# Patient Record
Sex: Female | Born: 1975 | Race: White | Hispanic: No | Marital: Married | Smoking: Current every day smoker
Health system: Southern US, Community
[De-identification: ages and names within clinical notes are randomized; demographics above are authoritative.]

## PROBLEM LIST (undated history)

## (undated) HISTORY — PX: ABDOMINAL SURGERY: SHX537

## (undated) HISTORY — PX: ABLATION: SHX5711

---

## 2012-10-01 ENCOUNTER — Emergency Department: Payer: Self-pay | Admitting: Emergency Medicine

## 2012-10-04 ENCOUNTER — Emergency Department: Payer: Self-pay | Admitting: Emergency Medicine

## 2014-12-24 ENCOUNTER — Emergency Department: Payer: Self-pay

## 2014-12-24 ENCOUNTER — Encounter: Payer: Self-pay | Admitting: Emergency Medicine

## 2014-12-24 ENCOUNTER — Emergency Department
Admission: EM | Admit: 2014-12-24 | Discharge: 2014-12-24 | Disposition: A | Discharge status: Home / Self Care | Payer: Self-pay | Attending: Emergency Medicine | Admitting: Emergency Medicine

## 2014-12-24 DIAGNOSIS — R1031 Right lower quadrant pain: Secondary | ICD-10-CM

## 2014-12-24 DIAGNOSIS — N8320 Unspecified ovarian cysts: Secondary | ICD-10-CM | POA: Insufficient documentation

## 2014-12-24 DIAGNOSIS — Z3202 Encounter for pregnancy test, result negative: Secondary | ICD-10-CM | POA: Insufficient documentation

## 2014-12-24 DIAGNOSIS — R911 Solitary pulmonary nodule: Secondary | ICD-10-CM | POA: Insufficient documentation

## 2014-12-24 DIAGNOSIS — Z720 Tobacco use: Secondary | ICD-10-CM | POA: Insufficient documentation

## 2014-12-24 LAB — CBC
HCT: 38.6 % (ref 35.0–47.0)
HEMOGLOBIN: 13 g/dL (ref 12.0–16.0)
MCH: 31.9 pg (ref 26.0–34.0)
MCHC: 33.7 g/dL (ref 32.0–36.0)
MCV: 94.5 fL (ref 80.0–100.0)
PLATELETS: 267 10*3/uL (ref 150–440)
RBC: 4.09 MIL/uL (ref 3.80–5.20)
RDW: 14 % (ref 11.5–14.5)
WBC: 9.5 10*3/uL (ref 3.6–11.0)

## 2014-12-24 LAB — URINALYSIS COMPLETE WITH MICROSCOPIC (ARMC ONLY)
BILIRUBIN URINE: NEGATIVE
Glucose, UA: NEGATIVE mg/dL
Hgb urine dipstick: NEGATIVE
Ketones, ur: NEGATIVE mg/dL
Leukocytes, UA: NEGATIVE
Nitrite: NEGATIVE
PH: 8 (ref 5.0–8.0)
Protein, ur: NEGATIVE mg/dL
Specific Gravity, Urine: 1.01 (ref 1.005–1.030)

## 2014-12-24 LAB — POCT PREGNANCY, URINE: Preg Test, Ur: NEGATIVE

## 2014-12-24 LAB — BASIC METABOLIC PANEL
Anion gap: 7 (ref 5–15)
BUN: 18 mg/dL (ref 6–20)
CALCIUM: 8.9 mg/dL (ref 8.9–10.3)
CO2: 25 mmol/L (ref 22–32)
CREATININE: 0.91 mg/dL (ref 0.44–1.00)
Chloride: 106 mmol/L (ref 101–111)
GFR calc Af Amer: >60 mL/min (ref >60)
GFR calc non Af Amer: >60 mL/min (ref >60)
GLUCOSE: 119 mg/dL — AB (ref 65–99)
Potassium: 4 mmol/L (ref 3.5–5.1)
Sodium: 138 mmol/L (ref 135–145)

## 2014-12-24 LAB — CHLAMYDIA/NGC RT PCR (ARMC ONLY)
CHLAMYDIA TR: NOT DETECTED
N gonorrhoeae: NOT DETECTED

## 2014-12-24 LAB — WET PREP, GENITAL
CLUE CELLS WET PREP: NONE SEEN
TRICH WET PREP: NONE SEEN
WBC, Wet Prep HPF POC: NONE SEEN
YEAST WET PREP: NONE SEEN

## 2014-12-24 LAB — TROPONIN I

## 2014-12-24 MED ORDER — MORPHINE SULFATE (PF) 4 MG/ML IV SOLN
4.0000 mg | Freq: Once | INTRAVENOUS | Status: AC
  Administered 2014-12-24: 4 mg via INTRAVENOUS
  Filled 2014-12-24: qty 1

## 2014-12-24 MED ORDER — OXYCODONE-ACETAMINOPHEN 5-325 MG PO TABS: 1.0000 | ORAL_TABLET | Freq: Four times a day (QID) | ORAL | Status: DC | PRN

## 2014-12-24 MED ORDER — ONDANSETRON HCL 4 MG/2ML IJ SOLN
4.0000 mg | Freq: Once | INTRAMUSCULAR | Status: AC
  Administered 2014-12-24: 4 mg via INTRAVENOUS
  Filled 2014-12-24: qty 2

## 2014-12-24 MED ORDER — IOHEXOL 350 MG/ML SOLN
125.0000 mL | Freq: Once | INTRAVENOUS | Status: AC | PRN
  Administered 2014-12-24: 150 mL via INTRAVENOUS
  Filled 2014-12-24: qty 125

## 2014-12-24 MED ORDER — SODIUM CHLORIDE 0.9 % IV BOLUS (SEPSIS)
1000.0000 mL | Freq: Once | INTRAVENOUS | Status: AC
  Administered 2014-12-24: 1000 mL via INTRAVENOUS

## 2014-12-24 NOTE — ED Notes (Signed)
Urine preg NEGATIVE. 

## 2014-12-24 NOTE — ED Notes (Signed)
Pt states right sided lower abd pain and left sided tenderness, states increasing pain this AM with chest pain now, pressure on the left side, right sided pain is constant, states pressure when  She urinates, states chest pains are barely there now, denies any vaginal bleeding or discharge

## 2014-12-24 NOTE — ED Notes (Signed)
Pt presents with chest pain and abd pain started this am. Denies any n/v/d.

## 2014-12-24 NOTE — Discharge Instructions (Signed)
Abdominal Pain, Women °Abdominal (stomach, pelvic, or belly) pain can be caused by many things. It is important to tell your doctor: °· The location of the pain. °· Does it come and go or is it present all the time? °· Are there things that start the pain (eating certain foods, exercise)? °· Are there other symptoms associated with the pain (fever, nausea, vomiting, diarrhea)? °All of this is helpful to know when trying to find the cause of the pain. °CAUSES  °· Stomach: virus or bacteria infection, or ulcer. °· Intestine: appendicitis (inflamed appendix), regional ileitis (Crohn's disease), ulcerative colitis (inflamed colon), irritable bowel syndrome, diverticulitis (inflamed diverticulum of the colon), or cancer of the stomach or intestine. °· Gallbladder disease or stones in the gallbladder. °· Kidney disease, kidney stones, or infection. °· Pancreas infection or cancer. °· Fibromyalgia (pain disorder). °· Diseases of the female organs: °· Uterus: fibroid (non-cancerous) tumors or infection. °· Fallopian tubes: infection or tubal pregnancy. °· Ovary: cysts or tumors. °· Pelvic adhesions (scar tissue). °· Endometriosis (uterus lining tissue growing in the pelvis and on the pelvic organs). °· Pelvic congestion syndrome (female organs filling up with blood just before the menstrual period). °· Pain with the menstrual period. °· Pain with ovulation (producing an egg). °· Pain with an IUD (intrauterine device, birth control) in the uterus. °· Cancer of the female organs. °· Functional pain (pain not caused by a disease, may improve without treatment). °· Psychological pain. °· Depression. °DIAGNOSIS  °Your doctor will decide the seriousness of your pain by doing an examination. °· Blood tests. °· X-rays. °· Ultrasound. °· CT scan (computed tomography, special type of X-ray). °· MRI (magnetic resonance imaging). °· Cultures, for infection. °· Barium enema (dye inserted in the large intestine, to better view it with  X-rays). °· Colonoscopy (looking in intestine with a lighted tube). °· Laparoscopy (minor surgery, looking in abdomen with a lighted tube). °· Major abdominal exploratory surgery (looking in abdomen with a large incision). °TREATMENT  °The treatment will depend on the cause of the pain.  °· Many cases can be observed and treated at home. °· Over-the-counter medicines recommended by your caregiver. °· Prescription medicine. °· Antibiotics, for infection. °· Birth control pills, for painful periods or for ovulation pain. °· Hormone treatment, for endometriosis. °· Nerve blocking injections. °· Physical therapy. °· Antidepressants. °· Counseling with a psychologist or psychiatrist. °· Minor or major surgery. °HOME CARE INSTRUCTIONS  °· Do not take laxatives, unless directed by your caregiver. °· Take over-the-counter pain medicine only if ordered by your caregiver. Do not take aspirin because it can cause an upset stomach or bleeding. °· Try a clear liquid diet (broth or water) as ordered by your caregiver. Slowly move to a bland diet, as tolerated, if the pain is related to the stomach or intestine. °· Have a thermometer and take your temperature several times a day, and record it. °· Bed rest and sleep, if it helps the pain. °· Avoid sexual intercourse, if it causes pain. °· Avoid stressful situations. °· Keep your follow-up appointments and tests, as your caregiver orders. °· If the pain does not go away with medicine or surgery, you may try: °· Acupuncture. °· Relaxation exercises (yoga, meditation). °· Group therapy. °· Counseling. °SEEK MEDICAL CARE IF:  °· You notice certain foods cause stomach pain. °· Your home care treatment is not helping your pain. °· You need stronger pain medicine. °· You want your IUD removed. °· You feel faint or   lightheaded.  You develop nausea and vomiting.  You develop a rash.  You are having side effects or an allergy to your medicine. SEEK IMMEDIATE MEDICAL CARE IF:   Your  pain does not go away or gets worse.  You have a fever.  Your pain is felt only in portions of the abdomen. The right side could possibly be appendicitis. The left lower portion of the abdomen could be colitis or diverticulitis.  You are passing blood in your stools (bright red or black tarry stools, with or without vomiting).  You have blood in your urine.  You develop chills, with or without a fever.  You pass out. MAKE SURE YOU:   Understand these instructions.  Will watch your condition.  Will get help right away if you are not doing well or get worse. Document Released: 01/16/2007 Document Revised: 08/05/2013 Document Reviewed: 02/05/2009 [facility] Patient Information 2015 [place], [place]. This information is not intended to replace advice given to you by your health care provider. Make sure you discuss any questions you have with your health care provider.  Pulmonary Nodule A pulmonary nodule is a small, round growth of tissue in the lung. Pulmonary nodules can range in size from less than 1/5 inch (4 mm) to a little bigger than an inch (25 mm). Most pulmonary nodules are detected when imaging tests of the lung are being performed for a different problem. Pulmonary nodules are usually not cancerous (benign). However, some pulmonary nodules are cancerous (malignant). Follow-up treatment or testing is based on the size of the pulmonary nodule and your risk of getting lung cancer.  CAUSES Benign pulmonary nodules can be caused by various things. Some of the causes include:   Bacterial, fungal, or viral infections. This is usually an old infection that is no longer active, but it can sometimes be a current, active infection.  A benign mass of tissue.  Inflammation from conditions such as rheumatoid arthritis.   Abnormal blood vessels in the lungs. Malignant pulmonary nodules can result from lung cancer or from cancers that spread to the lung from other places in the body. SIGNS  AND SYMPTOMS Pulmonary nodules usually do not cause symptoms. DIAGNOSIS Most often, pulmonary nodules are found incidentally when an X-ray or CT scan is performed to look for some other problem in the lung area. To help determine whether a pulmonary nodule is benign or malignant, your health care provider will take a medical history and order a variety of tests. Tests done may include:   Blood tests.  A skin test called a tuberculin test. This test is used to determine if you have been exposed to the germ that causes tuberculosis.   Chest X-rays. If possible, a new X-ray may be compared with X-rays you have had in the past.   CT scan. This test shows smaller pulmonary nodules more clearly than an X-ray.   Positron emission tomography (PET) scan. In this test, a safe amount of a radioactive substance is injected into the bloodstream. Then, the scan takes a picture of the pulmonary nodule. The radioactive substance is eliminated from your body in your urine.   Biopsy. A tiny piece of the pulmonary nodule is removed so it can be checked under a microscope. TREATMENT  Pulmonary nodules that are benign normally do not require any treatment because they usually do not cause symptoms or breathing problems. Your health care provider may want to monitor the pulmonary nodule through follow-up CT scans. The frequency of these CT  scans will vary based on the size of the nodule and the risk factors for lung cancer. For example, CT scans will need to be done more frequently if the pulmonary nodule is larger and if you have a history of smoking and a family history of cancer. Further testing or biopsies may be done if any follow-up CT scan shows that the size of the pulmonary nodule has increased. HOME CARE INSTRUCTIONS  Only take over-the-counter or prescription medicines as directed by your health care provider.  Keep all follow-up appointments with your health care provider. SEEK MEDICAL CARE  IF:  You have trouble breathing when you are active.   You feel sick or unusually tired.   You do not feel like eating.   You lose weight without trying to.   You develop chills or night sweats.  SEEK IMMEDIATE MEDICAL CARE IF:  You cannot catch your breath, or you begin wheezing.   You cannot stop coughing.   You cough up blood.   You become dizzy or feel like you are going to pass out.   You have sudden chest pain.   You have a fever or persistent symptoms for more than 2-3 days.   You have a fever and your symptoms suddenly get worse. MAKE SURE YOU:  Understand these instructions.  Will watch your condition.  Will get help right away if you are not doing well or get worse. Document Released: 01/16/2009 Document Revised: 11/21/2012 Document Reviewed: 09/10/2012 [facility] Patient Information 2015 [place], [place]. This information is not intended to replace advice given to you by your health care provider. Make sure you discuss any questions you have with your health care provider.

## 2014-12-24 NOTE — ED Provider Notes (Addendum)
[facility] Emergency Department Provider Note  ____________________________________________  Time seen: Approximately 6PM  I have reviewed the triage vital signs and the nursing notes.   HISTORY  Chief Complaint Chest Pain and Abdominal Pain    HPI Nicole Valdez is a 39 y.o. female with a history of kidney stones who is presenting today with right lower quadrant abdominal pain over the last 2 weeks. She says the pain has been intermittent and coming going but worsening over the past day. She denies any nausea vomiting or diarrhea. She says that she experiences a "pressure" when she urinates. Has not noticed any blood in her urine. Denies any vaginal bleeding or discharge. Had her last kidney stone 4 years ago which needed laser lithotripsy.Patient says that her pain improves when pushing on her abdomen.   History reviewed. No pertinent past medical history.  There are no active problems to display for this patient.   Past Surgical History  Procedure Laterality Date  . Abdominal surgery    . Ablation      No current outpatient prescriptions on file.  Allergies Review of patient's allergies indicates no known allergies.  No family history on file.  Social History Social History  Substance Use Topics  . Smoking status: Current Some Day Smoker  . Smokeless tobacco: None  . Alcohol Use: No    Review of Systems Constitutional: No fever/chills Eyes: No visual changes. ENT: No sore throat. Cardiovascular: Also complaining of about one hour of chest pain earlier today that she describes as a tightness. However, says that what is bothering her primarily has been her abdominal pain. Denies any associated shortness of breath or diaphoresis. Respiratory: Denies shortness of breath. Gastrointestinal:  No nausea, no vomiting.  No diarrhea.  No constipation. Genitourinary: As above Musculoskeletal: Negative for back pain. Skin: Negative for  rash. Neurological: Negative for headaches, focal weakness or numbness.  10-point ROS otherwise negative.  ____________________________________________   PHYSICAL EXAM:  VITAL SIGNS: ED Triage Vitals  Enc Vitals Group     BP 12/24/14 1547 168/95 mmHg     Pulse Rate 12/24/14 1547 79     Resp 12/24/14 1547 20     Temp 12/24/14 1547 98.4 F (36.9 C)     Temp Source 12/24/14 1547 Oral     SpO2 12/24/14 1547 99 %     Weight 12/24/14 1543 196 lb (88.905 kg)     Height 12/24/14 1543  (1.6 m)     Head Cir --      Peak Flow --      Pain Score 12/24/14 1544 8     Pain Loc --      Pain Edu? --      Excl. in GC? --     Constitutional: Alert and oriented. Visibly uncomfortable. in no acute distress. Eyes: Conjunctivae are normal. PERRL. EOMI. Head: Atraumatic. Nose: No congestion/rhinnorhea. Mouth/Throat: Mucous membranes are moist.  Oropharynx non-erythematous. Neck: No stridor.   Cardiovascular: Normal rate, regular rhythm. Grossly normal heart sounds.  Good peripheral circulation. Respiratory: Normal respiratory effort.  No retractions. Lungs CTAB. Gastrointestinal: Soft and nontender. No distention. No abdominal bruits. No CVA tenderness. Genitourinary:  Normal external exam. Speculum exam with small amount of white thick discharge. Bimanual exam without CMT or uterine or adnexal tenderness to palpation. Musculoskeletal: No lower extremity tenderness nor edema.  No joint effusions. Neurologic:  Normal speech and language. No gross focal neurologic deficits are appreciated. No gait instability. Skin:  Skin  is warm, dry and intact. No rash noted. Psychiatric: Mood and affect are normal. Speech and behavior are normal.  ____________________________________________   LABS (all labs ordered are listed, but only abnormal results are displayed)  Labs Reviewed  BASIC METABOLIC PANEL - Abnormal; Notable for the following:    Glucose, Bld 119 (*)    All other components within  normal limits  URINALYSIS COMPLETEWITH MICROSCOPIC ([facility] ONLY) - Abnormal; Notable for the following:    Color, Urine YELLOW (*)    APPearance CLOUDY (*)    Bacteria, UA RARE (*)    Squamous Epithelial / LPF 0-5 (*)    All other components within normal limits  WET PREP, GENITAL  CHLAMYDIA/NGC RT PCR ([facility] ONLY)  CBC  TROPONIN I  POC URINE PREG, ED  POCT PREGNANCY, URINE   ____________________________________________  EKG d this ECG.   ED ECG REPORT I, Arelia Longest, the attending physician, personally viewed and interpreted this ECG.   Date: 12/24/2014  EKG Time: 1544  Rate: 81  Rhythm: normal sinus rhythm  Axis: Normal axis  Intervals:none  ST&T Change: No ST segment elevation or depression. No abnormal T-wave inversion.  ____________________________________________  RADIOLOGY  No acute findings on CAT scan of the abdomen and pelvis.  No acute disease on the chest x-Jory Tanguma. I personally reviewed these films.  CAT scan angiography of the chest abdomen and pelvis without pulmonary embolus. Several pulmonary nodules. Follow-up recommended in one year. No evidence for urinary tract obstruction. Normal appendix. Incidental note of small right ovarian corpus luteum cyst measuring 2 cm. ____________________________________________   PROCEDURES  ____________________________________________   INITIAL IMPRESSION / ASSESSMENT AND PLAN / ED COURSE  Pertinent labs & imaging results that were available during my care of the patient were reviewed by me and considered in my medical decision making (see chart for details). ----------------------------------------- 8:08 PM on 12/24/2014 -----------------------------------------  Patient with persistent had of 10 pain after morphine. Up until this point she has had a negative workup with a noncontrast scan of her abdomen which did not show any pathology. The uterine cyst does not explain her pain. I discussed with  the patient her symptoms as well as the negative workup thus far. We discussed a contrast scan to explore her vasculature. We agreed that we would proceed with the scan. The patient has not been to the hospital in several years. I do believe that she has an organic pathology for this pain. We also discussed possible muscle strain but she denies any heavy lifting or bending. This would also be inconsistent with the lack of tenderness on palpation.  ----------------------------------------- 10:10 PM on 12/24/2014 -----------------------------------------  Patient still uncomfortable but not one further pain medication. Unclear cause of the patient's pain. Question midcycle pain because of worsening today and corpus luteum cyst on the right ovary. No signs of torsion on imaging studies. We'll discharge with Percocet for pain control and will give follow-up with OB/GYN. Patient given copy of CAT scan report to follow-up with her primary care doctor for possible further imaging. We discussed the imaging findings including the cyst as well as the pulmonary nodules and the possibility although not definite diagnosis of early cancer. The patient understands this and will follow up with her primary care doctor for evaluation of further imaging. ____________________________________________   FINAL CLINICAL IMPRESSION(S) / ED DIAGNOSES  Acute right-sided ovarian cyst. Multiple pulmonary nodules. Acute right lower quadrant abdominal pain. Initial visit.    Myrna Blazer, MD 12/24/14 2212  Patient chest pain free throughout stay in exam area.  Myrna Blazer, MD 12/24/14 2214

## 2016-12-22 IMAGING — CT CT CTA ABD/PEL W/CM AND/OR W/O CM
1 of 2 series · 14 of 32 positions shown, 18 images · IV contrast (omnipaque)
Comparison: 12/24/2014 chest x-ray and noncontrast CT

CLINICAL DATA: Pt states right sided lower abd pain and left sided
tenderness, states increasing pain this AM with chest pain now,
pressure on the left side, right sided pain is constant, states
pressure when she urinates.

EXAM:
CT ANGIOGRAPHY CHEST
CT ABDOMEN AND PELVIS WITH CONTRAST
TECHNIQUE: Multidetector CT imaging of the chest was performed using the
standard protocol during bolus administration of intravenous
contrast. Multiplanar CT image reconstructions and MIPs were
obtained to evaluate the vascular anatomy. Multidetector CT imaging
of the abdomen and pelvis was performed using the standard protocol
during bolus administration of intravenous contrast.
CONTRAST:  150mL OMNIPAQUE IOHEXOL 350 MG/ML SOLN

[Series 6: cta cap · axial · 0.80mm/px · z∈[-956,-418]mm · 14 of 307 slices shown, 18 images]
[im 25/307  soft-tissue]
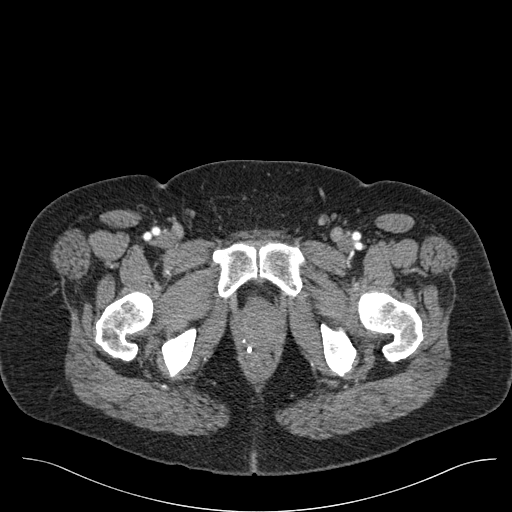
[im 25/307  bone]
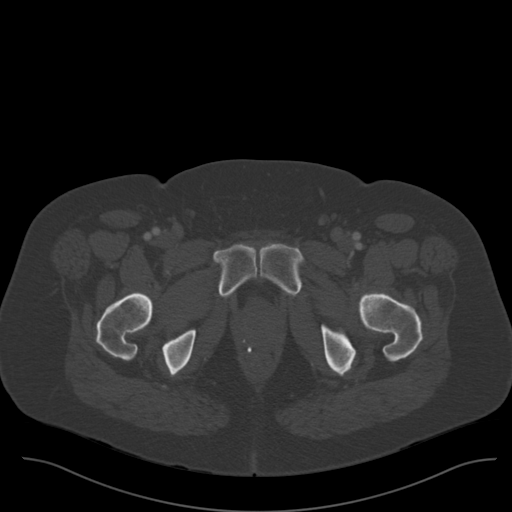
[im 49/307  soft-tissue]
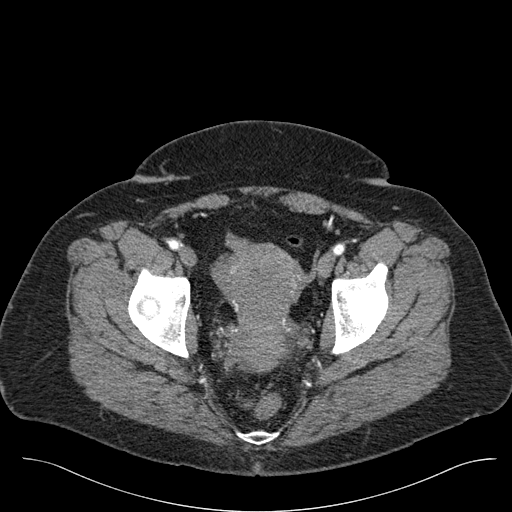
[im 74/307  soft-tissue]
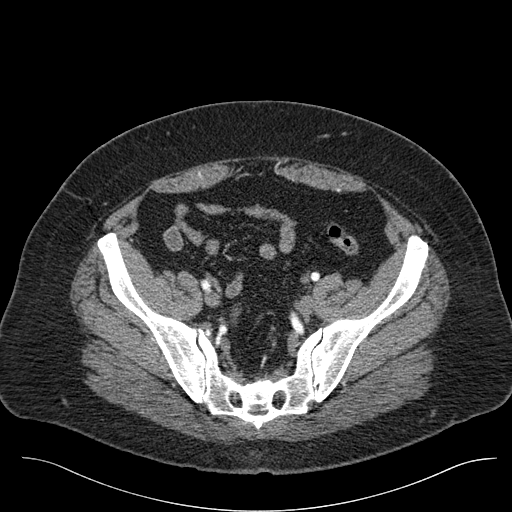
[im 98/307  soft-tissue]
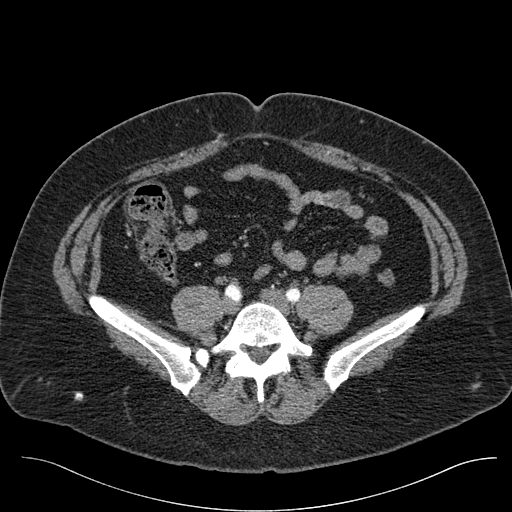
[im 123/307  soft-tissue]
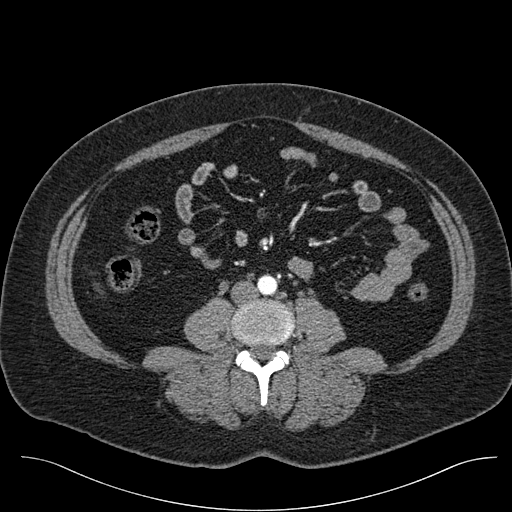
[im 147/307  soft-tissue]
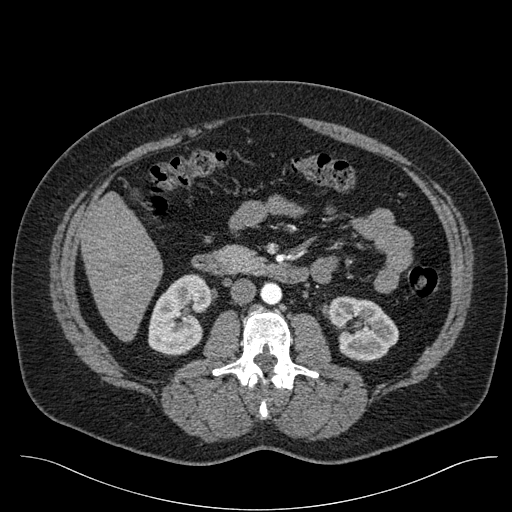
[im 172/307  soft-tissue]
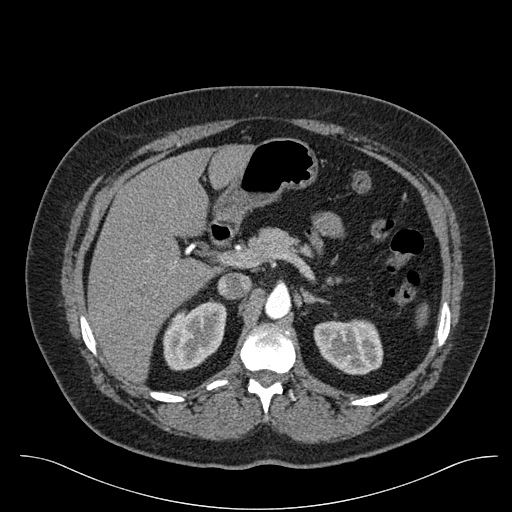
[im 196/307  soft-tissue]
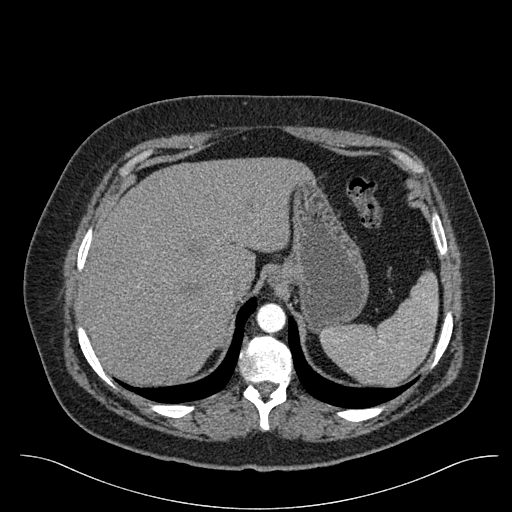
[im 221/307  soft-tissue]
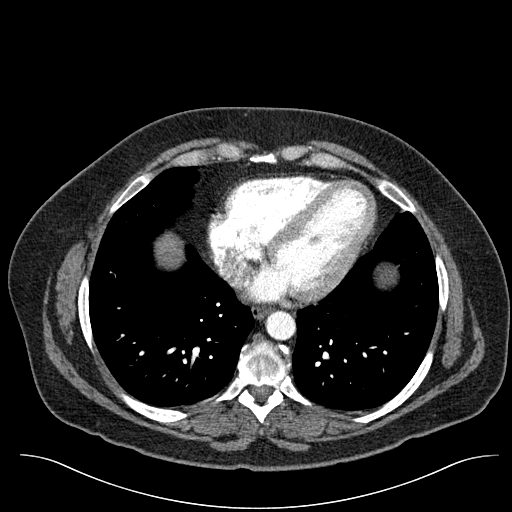
[im 221/307  bone]
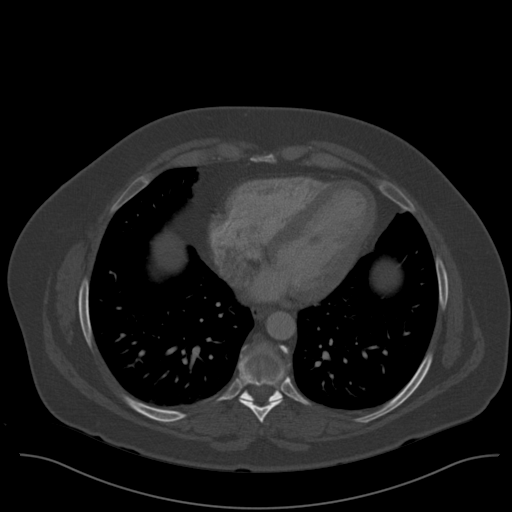
[im 245/307  soft-tissue]
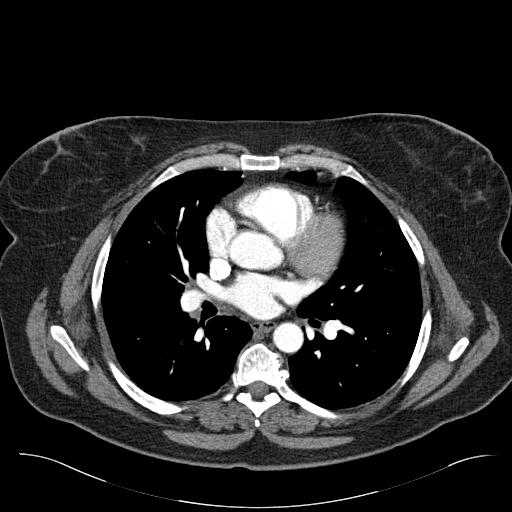
[im 258/307  lung]
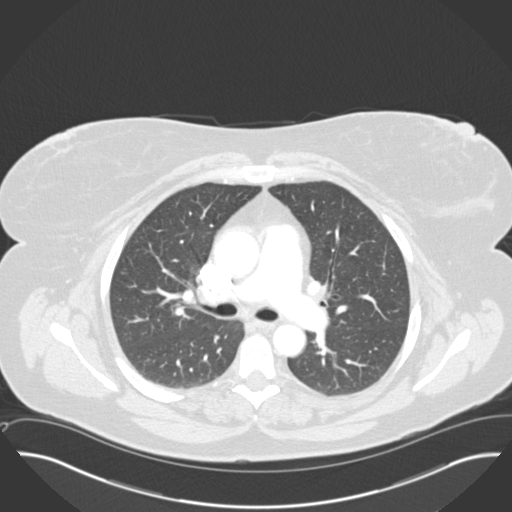
[im 270/307  soft-tissue]
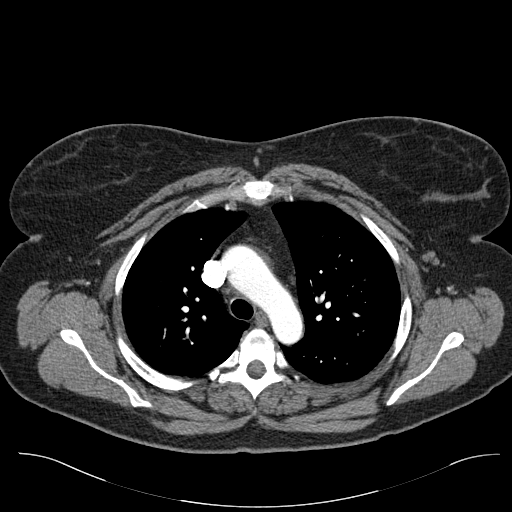
[im 270/307  lung]
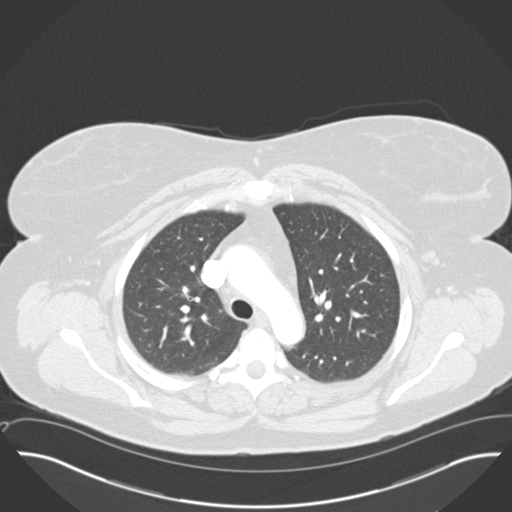
[im 282/307  lung]
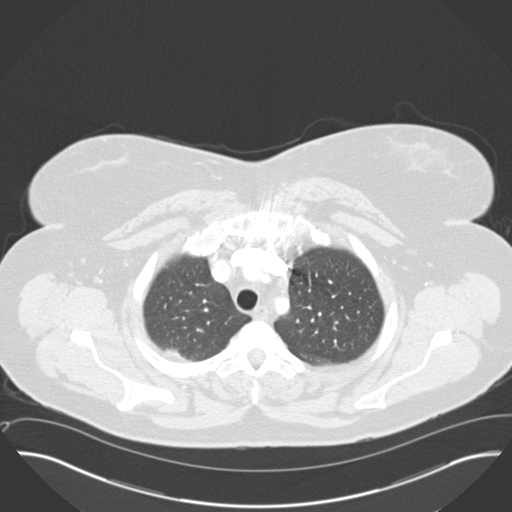
[im 294/307  soft-tissue]
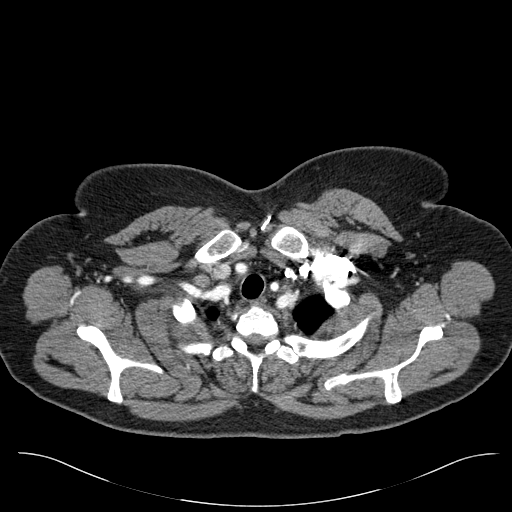
[im 294/307  lung]
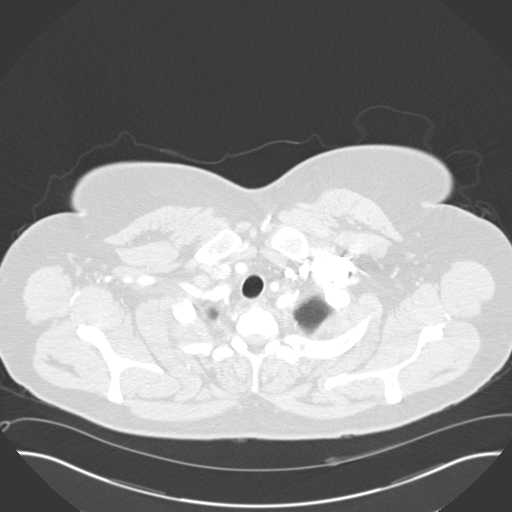

[14 of 32 positions shown; findings below may reference images not displayed]

FINDINGS: CT CHEST FINDINGS

Heart: Heart size is normal. No imaged pericardial effusion or
significant coronary artery calcifications.

Vascular structures: Pulmonary arteries are well opacified. There is
no evidence for acute pulmonary embolus. Normal appearance of the
thoracic aorta.

Mediastinum/thyroid: The visualized portion of the thyroid gland has
a normal appearance. No significant mediastinal or hilar adenopathy.
Esophagus is normal in appearance.

Lungs/Airways: A 4 mm nodule is identified within the left upper
lobe on image 15 of series 3. A 4 mm nodule is identified within the
right upper lobe on image 25 of series 3. There are no pleural
effusions. No focal consolidations.

Chest wall/osseous: There are mild degenerative changes in the
thoracic spine. No suspicious lytic or blastic lesions are
identified.

CT ABDOMEN AND PELVIS FINDINGS

Upper abdomen: No focal abnormality identified within the liver,
spleen, pancreas, or adrenal glands. Gallbladder is present. There
is symmetric enhancement of both kidneys. No renal mass or
hydronephrosis.

Gastrointestinal tract: The stomach and small bowel loops are normal
in appearance. The appendix is well seen and has a normal
appearance. Colonic loops are normal in appearance.

Pelvis: Uterus is present. Right corpus luteum cyst is incidentally
noted, measuring 2.0 cm. Region of the left ovary is normal in
appearance. No free pelvic fluid. Bladder has a normal appearance.

Retroperitoneum: No evidence for aortic aneurysm. No retroperitoneal
or mesenteric adenopathy.

Abdominal wall: Unremarkable in appearance.

Osseous structures: Unremarkable.
IMPRESSION: 1. Technically adequate exam showing no acute pulmonary embolus.
2. No evidence for acute abnormality of the chest or abdomen.
3. 4 mm nodule within the left upper lobe. 4 mm nodule within the
right upper lobe. If the patient is at high risk for bronchogenic
carcinoma, follow-up chest CT at 1 year is recommended. If the
patient is at low risk, no follow-up is needed. This recommendation
follows the consensus statement: Guidelines for Management of Small
Pulmonary Nodules Detected on CT Scans: A Statement from the
4. No evidence for urinary tract obstruction.
5. Normal appendix.
6. Incidental note of small right ovarian corpus luteum cyst,
measuring 2.0 cm.

## 2017-09-07 ENCOUNTER — Emergency Department
Admission: EM | Admit: 2017-09-07 | Discharge: 2017-09-07 | Disposition: A | Discharge status: Home / Self Care | Payer: Self-pay | Attending: Emergency Medicine | Admitting: Emergency Medicine

## 2017-09-07 ENCOUNTER — Other Ambulatory Visit: Payer: Self-pay

## 2017-09-07 ENCOUNTER — Encounter: Payer: Self-pay | Admitting: Emergency Medicine

## 2017-09-07 ENCOUNTER — Emergency Department: Payer: Self-pay

## 2017-09-07 DIAGNOSIS — R109 Unspecified abdominal pain: Secondary | ICD-10-CM

## 2017-09-07 DIAGNOSIS — R1031 Right lower quadrant pain: Secondary | ICD-10-CM | POA: Insufficient documentation

## 2017-09-07 DIAGNOSIS — F17200 Nicotine dependence, unspecified, uncomplicated: Secondary | ICD-10-CM | POA: Insufficient documentation

## 2017-09-07 LAB — URINALYSIS, COMPLETE (UACMP) WITH MICROSCOPIC
BACTERIA UA: NONE SEEN
Bilirubin Urine: NEGATIVE
Glucose, UA: NEGATIVE mg/dL
Ketones, ur: NEGATIVE mg/dL
LEUKOCYTES UA: NEGATIVE
NITRITE: NEGATIVE
PROTEIN: NEGATIVE mg/dL
SPECIFIC GRAVITY, URINE: 1.018 (ref 1.005–1.030)
pH: 6 (ref 5.0–8.0)

## 2017-09-07 LAB — POCT PREGNANCY, URINE: PREG TEST UR: NEGATIVE

## 2017-09-07 MED ORDER — LISINOPRIL-HYDROCHLOROTHIAZIDE 10-12.5 MG PO TABS: 1.0000 | ORAL_TABLET | Freq: Every day | ORAL | 1 refills | Status: DC

## 2017-09-07 MED ORDER — KETOROLAC TROMETHAMINE 30 MG/ML IJ SOLN
INTRAMUSCULAR | Status: AC
  Filled 2017-09-07: qty 1

## 2017-09-07 MED ORDER — SODIUM CHLORIDE 0.9 % IV BOLUS
1000.0000 mL | Freq: Once | INTRAVENOUS | Status: AC
  Administered 2017-09-07: 1000 mL via INTRAVENOUS

## 2017-09-07 MED ORDER — KETOROLAC TROMETHAMINE 30 MG/ML IJ SOLN
30.0000 mg | Freq: Once | INTRAMUSCULAR | Status: AC
  Administered 2017-09-07: 30 mg via INTRAVENOUS

## 2017-09-07 MED ORDER — DICYCLOMINE HCL 20 MG PO TABS: 20.0000 mg | ORAL_TABLET | Freq: Three times a day (TID) | ORAL | 0 refills | Status: AC | PRN

## 2017-09-07 NOTE — ED Notes (Signed)
AAOx3.  Skin warm and dry. NAD.  States she used to take medication for her BP, but has not taken any in over a year and a half.  Cannot recall the name of medication.

## 2017-09-07 NOTE — ED Triage Notes (Signed)
Pt reports that she developed right sided flank pain on Sunday, she thought that yesterday it may of started to go away, but pain has returned. She has had kidney stones in the past was unable to pass and had to do lithotripsy.

## 2017-09-07 NOTE — ED Notes (Signed)
AAOx3.  Skin warm and dry. NAD> ambulates with easy and steady gait.

## 2017-09-07 NOTE — ED Notes (Signed)
Patient transported to CT 

## 2017-09-07 NOTE — Discharge Instructions (Addendum)
Please seek medical attention for any high fevers, chest pain, shortness of breath, change in behavior, persistent vomiting, bloody stool or any other new or concerning symptoms.  

## 2017-09-07 NOTE — ED Provider Notes (Signed)
[facility] Emergency Department Provider Note   ____________________________________________   I have reviewed the triage vital signs and the nursing notes.   HISTORY  Chief Complaint Flank Pain   History limited by: Not Limited   HPI Nicole Valdez is a 42 y.o. female who presents to the emergency department today because of concerns for right flank pain.  The pain has been present for the past 5 days.  It has been severe. It has been accompanied by nausea. She denies any painful urination or bad odor to her urine. She has not noticed any blood in her urine. She states that she has had kidney stones in the past and this reminds her of her previous stones. The patient denies any fevers.    Per medical record review patient has a history of abdominal surgery.  History reviewed. No pertinent past medical history.  There are no active problems to display for this patient.   Past Surgical History:  Procedure Laterality Date  . ABDOMINAL SURGERY    . ABLATION      Prior to Admission medications   Medication Sig Start Date End Date Taking? Authorizing Provider  oxyCODONE-acetaminophen (ROXICET) 5-325 MG per tablet Take 1-2 tablets by mouth every 6 (six) hours as needed. 12/24/14   Schaevitz, Myra Rude, MD    Allergies Patient has no known allergies.  History reviewed. No pertinent family history.  Social History Social History   Tobacco Use  . Smoking status: Current Some Day Smoker  Substance Use Topics  . Alcohol use: No  . Drug use: Never    Review of Systems Constitutional: No fever/chills Eyes: No visual changes. ENT: No sore throat. Cardiovascular: Denies chest pain. Respiratory: Denies shortness of breath. Gastrointestinal: Positive for right flank pain and nausea.  Genitourinary: Negative for dysuria. Musculoskeletal: Negative for back pain. Skin: Negative for rash. Neurological: Negative for headaches, focal weakness or  numbness.  ____________________________________________   PHYSICAL EXAM:  VITAL SIGNS: ED Triage Vitals  Enc Vitals Group     BP 09/07/17 1507 (!) 199/106     Pulse Rate 09/07/17 1507 75     Resp 09/07/17 1507 18     Temp 09/07/17 1507 99.2 F (37.3 C)     Temp Source 09/07/17 1507 Oral     SpO2 09/07/17 1507 98 %     Weight 09/07/17 1510 220 lb (99.8 kg)     Height 09/07/17 1510 5\' 3"  (1.6 m)     Head Circumference --      Peak Flow --      Pain Score 09/07/17 1515 5   Constitutional: Alert and oriented.  Eyes: Conjunctivae are normal.  ENT      Head: Normocephalic and atraumatic.      Nose: No congestion/rhinnorhea.      Mouth/Throat: Mucous membranes are moist.      Neck: No stridor. Hematological/Lymphatic/Immunilogical: No cervical lymphadenopathy. Cardiovascular: Normal rate, regular rhythm.  No murmurs, rubs, or gallops.  Respiratory: Normal respiratory effort without tachypnea nor retractions. Breath sounds are clear and equal bilaterally. No wheezes/rales/rhonchi. Gastrointestinal: Soft and non tender. No rebound. No guarding.  Genitourinary: Deferred Musculoskeletal: Normal range of motion in all extremities. No lower extremity edema. Neurologic:  Normal speech and language. No gross focal neurologic deficits are appreciated.  Skin:  Skin is warm, dry and intact. No rash noted. Psychiatric: Mood and affect are normal. Speech and behavior are normal. Patient exhibits appropriate insight and judgment.  ____________________________________________  LABS (pertinent positives/negatives)  Upreg negative UA clear, negative nitrite, leukocytes. Small urine dipstick. 0-5 RBC and WBC  ____________________________________________   EKG  None  ____________________________________________    RADIOLOGY  CT renal No acute  abnormality  ____________________________________________   PROCEDURES  Procedures  ____________________________________________   INITIAL IMPRESSION / ASSESSMENT AND PLAN / ED COURSE  Pertinent labs & imaging results that were available during my care of the patient were reviewed by me and considered in my medical decision making (see chart for details).   Patient presented to the emergency department today because of concerns for right flank that is been present for a few days and is concerned she has recurrent kidney stones.  Although urine did not have any red blood cells a did have small urine dipstick.  CT renal was performed which did not show any acute cause of the patient's symptoms.  Do wonder if patient might of passed a recent stone.  However the obvious etiology of the patient's pain.  Discussed this with the patient.  Will plan on discharging with Bentyl in case of more intestinal disorder.   ____________________________________________   FINAL CLINICAL IMPRESSION(S) / ED DIAGNOSES  Final diagnoses:  Right flank pain     Note: This dictation was prepared with Dragon dictation. Any transcriptional errors that result from this process are unintentional     Phineas SemenGoodman, Khiara Shuping, MD 09/07/17 [phone]

## 2017-09-09 LAB — URINE CULTURE

## 2017-10-04 ENCOUNTER — Ambulatory Visit
Admission: EM | Admit: 2017-10-04 | Discharge: 2017-10-04 | Disposition: A | Discharge status: Home / Self Care | Payer: Self-pay | Attending: Family Medicine | Admitting: Family Medicine

## 2017-10-04 ENCOUNTER — Other Ambulatory Visit: Payer: Self-pay

## 2017-10-04 DIAGNOSIS — R059 Cough, unspecified: Secondary | ICD-10-CM

## 2017-10-04 DIAGNOSIS — J0100 Acute maxillary sinusitis, unspecified: Secondary | ICD-10-CM

## 2017-10-04 DIAGNOSIS — R05 Cough: Secondary | ICD-10-CM

## 2017-10-04 DIAGNOSIS — I10 Essential (primary) hypertension: Secondary | ICD-10-CM

## 2017-10-04 MED ORDER — GUAIFENESIN-CODEINE 100-10 MG/5ML PO SOLN: 10.0000 mL | Freq: Every evening | ORAL | 0 refills | Status: AC | PRN

## 2017-10-04 MED ORDER — HYDROCHLOROTHIAZIDE 25 MG PO TABS: 25.0000 mg | ORAL_TABLET | Freq: Every day | ORAL | 0 refills | Status: AC

## 2017-10-04 MED ORDER — LOSARTAN POTASSIUM 50 MG PO TABS: 50.0000 mg | ORAL_TABLET | Freq: Every day | ORAL | 0 refills | Status: AC

## 2017-10-04 MED ORDER — AMOXICILLIN-POT CLAVULANATE 875-125 MG PO TABS: 1.0000 | ORAL_TABLET | Freq: Two times a day (BID) | ORAL | 0 refills | Status: AC

## 2017-10-04 MED ORDER — BENZONATATE 100 MG PO CAPS: 100.0000 mg | ORAL_CAPSULE | Freq: Three times a day (TID) | ORAL | 0 refills | Status: AC | PRN

## 2017-10-04 MED ORDER — CLONIDINE HCL 0.1 MG PO TABS
0.1000 mg | ORAL_TABLET | Freq: Once | ORAL | Status: AC
  Administered 2017-10-04: 0.1 mg via ORAL

## 2017-10-04 MED ORDER — ALBUTEROL SULFATE HFA 108 (90 BASE) MCG/ACT IN AERS: 2.0000 | INHALATION_SPRAY | Freq: Four times a day (QID) | RESPIRATORY_TRACT | 0 refills | Status: AC | PRN

## 2017-10-04 NOTE — ED Provider Notes (Signed)
[facility]    CSN: [id number] Arrival date & time: 10/04/17  1420     History   Chief Complaint Chief Complaint  Patient presents with  . Cough    HPI Nicole Valdez is a 42 y.o. female.   42 year old female presents with nasal congestion, ear pressure, sore throat and cough for over 6 to 7 days. Symptoms have worsened over the past 2 days with low grade fever, more chest congestion and shortness of breath. Denies any chest pain, dizziness or GI symptoms. No other family or friends are ill. Also blood pressure is significantly elevated today. Patient indicates her blood pressure is usually near 200 over 100. Recently moved to the area and restarted Lisinopril 10mg  daily but is not helping and makes her nauseous. Last MD she saw [facility] on 09/07/17 prescribed Lisinopril with HCTZ but she has not filled this medication yet. She has not been on other medications in the past for blood pressure. She does smoke cigarettes daily but has decreased amount in the past few weeks. She does not currently have a PCP and does not have insurance coverage. She plans to becomes established with a PCP when she gets insurance coverage through work next month. No other chronic health issues.   The history is provided by the patient.    History reviewed. No pertinent past medical history.  There are no active problems to display for this patient.   Past Surgical History:  Procedure Laterality Date  . ABDOMINAL SURGERY    . ABLATION      OB History   None      Home Medications    Prior to Admission medications   Medication Sig Start Date End Date Taking? Authorizing Provider  dicyclomine (BENTYL) 20 MG tablet Take 1 tablet (20 mg total) by mouth 3 (three) times daily as needed (abdominal pain). 09/07/17  Yes Phineas SemenGoodman, Graydon, MD  albuterol (PROVENTIL HFA;VENTOLIN HFA) 108 (90 Base) MCG/ACT inhaler Inhale 2 puffs into the lungs every 6 (six) hours as needed for wheezing or shortness  of breath. 10/04/17   Sudie GrumblingAmyot, Zymiere Trostle Berry, NP  amoxicillin-clavulanate (AUGMENTIN) 875-125 MG tablet Take 1 tablet by mouth every 12 (twelve) hours for 7 days. 10/04/17 10/11/17  Sudie GrumblingAmyot, Raimi Guillermo Berry, NP  benzonatate (TESSALON) 100 MG capsule Take 1 capsule (100 mg total) by mouth 3 (three) times daily as needed for cough. 10/04/17   Sudie GrumblingAmyot, Crystalmarie Yasin Berry, NP  guaiFENesin-codeine 100-10 MG/5ML syrup Take 10 mLs by mouth at bedtime as needed for cough. 10/04/17   Sudie GrumblingAmyot, Topher Buenaventura Berry, NP  hydrochlorothiazide (HYDRODIURIL) 25 MG tablet Take 1 tablet (25 mg total) by mouth daily. 10/04/17   Sudie GrumblingAmyot, Coree Brame Berry, NP  losartan (COZAAR) 50 MG tablet Take 1 tablet (50 mg total) by mouth daily. 10/04/17   Sudie GrumblingAmyot, Caron Ode Berry, NP    Family History History reviewed. No pertinent family history.  Social History Social History   Tobacco Use  . Smoking status: Current Every Day Smoker    Packs/day: 1.00  . Smokeless tobacco: Never Used  Substance Use Topics  . Alcohol use: No  . Drug use: Never     Allergies   Patient has no known allergies.   Review of Systems Review of Systems  Constitutional: Positive for activity change, appetite change, chills, fatigue and fever.  HENT: Positive for congestion, ear pain (fullness), postnasal drip, rhinorrhea, sinus pressure and sore throat. Negative for ear discharge, mouth sores, nosebleeds, sinus pain, sneezing and  trouble swallowing.   Eyes: Negative for photophobia, pain, discharge, redness, itching and visual disturbance.  Respiratory: Positive for cough, chest tightness, shortness of breath and wheezing.   Cardiovascular: Negative for chest pain and palpitations.  Gastrointestinal: Negative for diarrhea, nausea and vomiting.  Genitourinary: Negative for decreased urine volume, difficulty urinating and flank pain.  Musculoskeletal: Negative for arthralgias, myalgias, neck pain and neck stiffness.  Skin: Negative for color change, rash and wound.  Allergic/Immunologic: Negative for  immunocompromised state.  Neurological: Positive for light-headedness and headaches. Negative for dizziness, tremors, seizures, syncope, weakness and numbness.  Hematological: Negative for adenopathy. Does not bruise/bleed easily.  Psychiatric/Behavioral: Negative.      Physical Exam Triage Vital Signs ED Triage Vitals  Enc Vitals Group     BP 10/04/17 1431 (!) 201/122     Pulse Rate 10/04/17 1431 87     Resp 10/04/17 1431 18     Temp 10/04/17 1431 100 F (37.8 C)     Temp Source 10/04/17 1431 Oral     SpO2 10/04/17 1431 96 %     Weight 10/04/17 1429 215 lb (97.5 kg)     Height 10/04/17 1429 5\' 3"  (1.6 m)     Head Circumference --      Peak Flow --      Pain Score 10/04/17 1429 4     Pain Loc --      Pain Edu? --      Excl. in GC? --    No data found.  Updated Vital Signs BP (!) 190/108 (BP Location: Right Arm)   Pulse 87   Temp 100 F (37.8 C) (Oral)   Resp 18   Ht 5\' 3"  (1.6 m)   Wt 215 lb (97.5 kg)   LMP 09/29/2017   SpO2 96%   BMI 38.09 kg/m   Visual Acuity Right Eye Distance:   Left Eye Distance:   Bilateral Distance:    Right Eye Near:   Left Eye Near:    Bilateral Near:     Physical Exam  Constitutional: She is oriented to person, place, and time. She appears well-developed and well-nourished. She is cooperative. She appears ill. No distress.  Patient resting in exam chair in no acute distress.   HENT:  Head: Normocephalic and atraumatic.  Right Ear: Hearing, external ear and ear canal normal. Tympanic membrane is bulging. Tympanic membrane is not injected and not erythematous. No middle ear effusion.  Left Ear: Hearing, external ear and ear canal normal. Tympanic membrane is bulging. Tympanic membrane is not injected and not erythematous.  No middle ear effusion.  Nose: Mucosal edema and rhinorrhea present. Right sinus exhibits maxillary sinus tenderness. Right sinus exhibits no frontal sinus tenderness. Left sinus exhibits maxillary sinus tenderness.  Left sinus exhibits no frontal sinus tenderness.  Mouth/Throat: Uvula is midline and mucous membranes are normal. She has dentures. Posterior oropharyngeal erythema present.  Eyes: Conjunctivae and EOM are normal.  Neck: Normal range of motion. Neck supple.  Cardiovascular: Normal rate, regular rhythm and normal heart sounds.  No murmur heard. Pulmonary/Chest: Effort normal. No stridor. No respiratory distress. She has decreased breath sounds in the right upper field, the right lower field, the left upper field and the left lower field. She has no wheezes. She has rhonchi in the right upper field and the left upper field. She has no rales.  Musculoskeletal: Normal range of motion.  Lymphadenopathy:    She has no cervical adenopathy.  Neurological: She is alert and  oriented to person, place, and time.  Skin: Skin is warm and dry. Capillary refill takes less than 2 seconds. No rash noted.  Psychiatric: She has a normal mood and affect. Her behavior is normal. Judgment and thought content normal.  Vitals reviewed.    UC Treatments / Results  Labs (all labs ordered are listed, but only abnormal results are displayed) Labs Reviewed - No data to display  EKG None  Radiology No results found.  Procedures Procedures (including critical care time)  Medications Ordered in UC Medications  cloNIDine (CATAPRES) tablet 0.1 mg (0.1 mg Oral Given 10/04/17 1444)    Initial Impression / Assessment and Plan / UC Course  I have reviewed the triage vital signs and the nursing notes.  Pertinent labs & imaging results that were available during my care of the patient were reviewed by me and considered in my medical decision making (see chart for details).    Catapress was given now to help lower blood pressure. After 30 minutes, blood pressure had decreased by 10-15 units. Reviewed with patient that blood pressure is not controlled with her current medication. Since she is experiencing side effects  with Lisinopril and not decreasing her blood pressure, recommend switch and trial Losartan 50mg  daily. Monitor blood pressure daily and if still >150/>90, add HCTZ 25mg  daily in AM.  Discussed that she probably has a mild sinus infection with cough. Discussed performing chest x-ray to rule out pneumonia but patient declined due to financial issues. Will treat with Augmentin 875mg  twice a day as directed. Take Tessalon cough pills every 8 hours as needed. Use Albuterol 2 puffs every 6 hours as needed for cough or wheezing. May also use Guaifenesin with codeine cough syrup only at night to help with cough. Good Rx coupons provided. Recommend follow-up here in 3 to 4 days if sinus and cough symptoms do not improve. Otherwise since she will be unable to see a PCP in the next 30 days, recommend follow-up here in 2 weeks for blood pressure recheck and management.  Final Clinical Impressions(s) / UC Diagnoses   Final diagnoses:  Acute non-recurrent maxillary sinusitis  Cough  Elevated blood pressure reading with diagnosis of hypertension     Discharge Instructions     Recommend start Augmentin 875mg  twice a day as directed. May take Tessalon cough pills 1 every 8 hours as needed for cough. May also use Albuterol inhaler 2 puffs every 6 hours as needed for wheezing and cough. May use Cough syrup with codeine 2 teaspoons at night for cough.  For your blood pressure, recommend switch to Losartan 50mg  tablet once daily. If blood pressure remains >150/>90, add HCTZ 25mg  daily. Continue to monitor blood pressure daily. Since you will be unable to see your PCP for over a month, recommend follow-up here for blood pressure recheck in about 2 weeks.     ED Prescriptions    Medication Sig Dispense Auth. Provider   amoxicillin-clavulanate (AUGMENTIN) 875-125 MG tablet Take 1 tablet by mouth every 12 (twelve) hours for 7 days. 14 tablet Lawerance Matsuo, Ali Lowe, NP   benzonatate (TESSALON) 100 MG capsule Take 1 capsule  (100 mg total) by mouth 3 (three) times daily as needed for cough. 21 capsule Sudie Grumbling, NP   albuterol (PROVENTIL HFA;VENTOLIN HFA) 108 (90 Base) MCG/ACT inhaler Inhale 2 puffs into the lungs every 6 (six) hours as needed for wheezing or shortness of breath. 1 Inhaler Tiffany Talarico, Ali Lowe, NP   guaiFENesin-codeine 100-10 MG/5ML syrup Take  10 mLs by mouth at bedtime as needed for cough. 118 mL Sudie Grumbling, NP   losartan (COZAAR) 50 MG tablet Take 1 tablet (50 mg total) by mouth daily. 30 tablet Sudie Grumbling, NP   hydrochlorothiazide (HYDRODIURIL) 25 MG tablet Take 1 tablet (25 mg total) by mouth daily. 30 tablet Sudie Grumbling, NP     Controlled Substance Prescriptions [place] Controlled Substance Registry consulted? Yes, I have consulted the [place] Controlled Substances Registry for this patient, and feel the risk/benefit ratio today is favorable for proceeding with this prescription for a controlled substance. No active Rx listed in the past 6 months.    Sudie Grumbling, NP 10/04/17 2036

## 2017-10-04 NOTE — ED Triage Notes (Signed)
Patient complains of cough, congestion, shortness of breath x Friday worsening over last few days.

## 2017-10-04 NOTE — Discharge Instructions (Addendum)
Recommend start Augmentin 875mg  twice a day as directed. May take Tessalon cough pills 1 every 8 hours as needed for cough. May also use Albuterol inhaler 2 puffs every 6 hours as needed for wheezing and cough. May use Cough syrup with codeine 2 teaspoons at night for cough.  For your blood pressure, recommend switch to Losartan 50mg  tablet once daily. If blood pressure remains >150/>90, add HCTZ 25mg  daily. Continue to monitor blood pressure daily. Since you will be unable to see your PCP for over a month, recommend follow-up here for blood pressure recheck in about 2 weeks.

## 2019-05-01 ENCOUNTER — Telehealth: Payer: Self-pay

## 2021-06-26 ENCOUNTER — Emergency Department
Admission: EM | Admit: 2021-06-26 | Discharge: 2021-06-26 | Disposition: A | Discharge status: Home / Self Care | Payer: BC Managed Care – PPO | Attending: Emergency Medicine | Admitting: Emergency Medicine

## 2021-06-26 ENCOUNTER — Emergency Department: Payer: BC Managed Care – PPO

## 2021-06-26 ENCOUNTER — Other Ambulatory Visit: Payer: Self-pay

## 2021-06-26 ENCOUNTER — Encounter: Payer: Self-pay | Admitting: Emergency Medicine

## 2021-06-26 DIAGNOSIS — D72829 Elevated white blood cell count, unspecified: Secondary | ICD-10-CM | POA: Insufficient documentation

## 2021-06-26 DIAGNOSIS — R42 Dizziness and giddiness: Secondary | ICD-10-CM | POA: Diagnosis not present

## 2021-06-26 DIAGNOSIS — R072 Precordial pain: Secondary | ICD-10-CM | POA: Diagnosis not present

## 2021-06-26 DIAGNOSIS — N179 Acute kidney failure, unspecified: Secondary | ICD-10-CM | POA: Diagnosis not present

## 2021-06-26 DIAGNOSIS — R079 Chest pain, unspecified: Secondary | ICD-10-CM | POA: Diagnosis present

## 2021-06-26 LAB — CBC
HCT: 38.1 % (ref 36.0–46.0)
Hemoglobin: 12.8 g/dL (ref 12.0–15.0)
MCH: 30.7 pg (ref 26.0–34.0)
MCHC: 33.6 g/dL (ref 30.0–36.0)
MCV: 91.4 fL (ref 80.0–100.0)
Platelets: 356 10*3/uL (ref 150–400)
RBC: 4.17 MIL/uL (ref 3.87–5.11)
RDW: 13.4 % (ref 11.5–15.5)
WBC: 14.6 10*3/uL — ABNORMAL HIGH (ref 4.0–10.5)
nRBC: 0 % (ref 0.0–0.2)

## 2021-06-26 LAB — BASIC METABOLIC PANEL
Anion gap: 10 (ref 5–15)
BUN: 35 mg/dL — ABNORMAL HIGH (ref 6–20)
CO2: 26 mmol/L (ref 22–32)
Calcium: 9 mg/dL (ref 8.9–10.3)
Chloride: 101 mmol/L (ref 98–111)
Creatinine, Ser: 1.44 mg/dL — ABNORMAL HIGH (ref 0.44–1.00)
GFR, Estimated: 45 mL/min — ABNORMAL LOW (ref >60)
Glucose, Bld: 160 mg/dL — ABNORMAL HIGH (ref 70–99)
Potassium: 3.1 mmol/L — ABNORMAL LOW (ref 3.5–5.1)
Sodium: 137 mmol/L (ref 135–145)

## 2021-06-26 LAB — TROPONIN I (HIGH SENSITIVITY): Troponin I (High Sensitivity): 8 ng/L (ref <18)

## 2021-06-26 LAB — D-DIMER, QUANTITATIVE: D-Dimer, Quant: 0.3 ug/mL-FEU (ref 0.00–0.50)

## 2021-06-26 LAB — POC URINE PREG, ED: Preg Test, Ur: NEGATIVE

## 2021-06-26 NOTE — ED Triage Notes (Signed)
Pt via POV from home. Pt c/o mid sternal CP, non-radiating states her doctor told her to come with concerns of a blood clot, denies any SOB. Denies cardiac hx. Pt states pain is intermittent and followed by an episode of lightheadedness. Pt is A&OX4 and NAD ?

## 2021-06-26 NOTE — ED Notes (Signed)
AVS provided to and discussed with patient. Pt verbalizes understanding of discharge instructions and denies any questions or concerns at this time. Pt ambulated out of department independently with steady gait.  

## 2021-06-26 NOTE — ED Notes (Signed)
Per Dr. Vicente Males, repeat troponin not needed.  ?

## 2021-06-26 NOTE — Discharge Instructions (Addendum)
Please use ibuprofen (Motrin) up to 800 mg every 8 hours, naproxen (Naprosyn) up to 500 mg every 12 hours, and/or acetaminophen (Tylenol) up to 4 g/day for any continued pain 

## 2021-06-26 NOTE — ED Notes (Signed)
Pt ambulated to room from Xray at this time.  ?

## 2021-06-26 NOTE — ED Provider Notes (Signed)
? ?[facility] ?Provider Note ? ? Event Date/Time  ? First MD Initiated Contact with Patient 06/26/21 1500   ?  (approximate) ?History  ?Chest Pain ? ?HPI ?Nicole Valdez is a 46 y.o. female with a past medical history of hypertension who presents from her primary doctor's office for evaluation of recurrent chest pain over the past 2 weeks.  Patient states that she has had intermittent central chest pain that is nonradiating, 4/10 in severity, and associated with mild lightheadedness.  Patient states that it presents for under 10 minutes at a time and resolves with decreased activity.  Patient states that this pain can start at any time including during activity and at rest.  Patient does state that when the pain occurs, taking a deep breath or pressing on her chest worsens her pain.  Patient has tried over-the-counter NSAIDs with minimal relief.  Denies any associated shortness of breath, vertigo, slurred speech, vision changes, or weakness/numbness/paresthesias in any extremity. ?Physical Exam  ?Triage Vital Signs: ?ED Triage Vitals [06/26/21 1448]  ?Enc Vitals Group  ?   BP 117/84  ?   Pulse Rate 90  ?   Resp 18  ?   Temp 99.5 ?F (37.5 ?C)  ?   Temp Source Oral  ?   SpO2 98 %  ?   Weight 228 lb (103.4 kg)  ?   Height 5\' 4"  (1.626 m)  ?   Head Circumference   ?   Peak Flow   ?   Pain Score 2  ?   Pain Loc   ?   Pain Edu?   ?   Excl. in GC?   ? ?Most recent vital signs: ?Vitals:  ? 06/26/21 1448 06/26/21 1534  ?BP: 117/84 105/68  ?Pulse: 90 79  ?Resp: 18 15  ?Temp: 99.5 ?F (37.5 ?C)   ?SpO2: 98% 99%  ? ?General: Awake, oriented x4. ?CV:  Good peripheral perfusion.  ?Resp:  Normal effort.  ?Abd:  No distention.  ?Other:  Overweight middle-aged Caucasian female laying in bed in no distress ?ED Results / Procedures / Treatments  ?Labs ?(all labs ordered are listed, but only abnormal results are displayed) ?Labs Reviewed  ?BASIC METABOLIC PANEL - Abnormal; Notable for the following components:  ?     Result Value  ? Potassium 3.1 (*)   ? Glucose, Bld 160 (*)   ? BUN 35 (*)   ? Creatinine, Ser 1.44 (*)   ? GFR, Estimated 45 (*)   ? All other components within normal limits  ?CBC - Abnormal; Notable for the following components:  ? WBC 14.6 (*)   ? All other components within normal limits  ?D-DIMER, QUANTITATIVE  ?POC URINE PREG, ED  ?TROPONIN I (HIGH SENSITIVITY)  ?TROPONIN I (HIGH SENSITIVITY)  ? ?EKG ?ED ECG REPORT ?I, 06/28/21, the attending physician, personally viewed and interpreted this ECG. ?Date: 06/26/2021 ?EKG Time: 1446 ?Rate: 92 ?Rhythm: normal sinus rhythm ?QRS Axis: normal ?Intervals: normal ?ST/T Wave abnormalities: normal ?Narrative Interpretation: no evidence of acute ischemia ?RADIOLOGY ?ED MD interpretation: 2 view chest x-ray interpreted by me shows no evidence of acute abnormalities including no pneumonia, pneumothorax, or widened mediastinum ?-Agree with radiology assessment ?Official radiology report(s): ?DG Chest 2 View ? ?Result Date: 06/26/2021 ?CLINICAL DATA:  Chest pain.  Lightheadedness. EXAM: CHEST - 2 VIEW COMPARISON:  12/24/2014 FINDINGS: The heart size and mediastinal contours are within normal limits. Both lungs are clear. Thoracic spine degenerative changes again noted.  IMPRESSION: No active cardiopulmonary disease. Electronically Signed   By: Danae Orleans M.D.   On: 06/26/2021 15:08   ?PROCEDURES: ?Critical Care performed: No ?.1-3 Lead EKG Interpretation ?Performed by: Merwyn Katos, MD ?Authorized by: Merwyn Katos, MD  ? ?  Interpretation: normal   ?  ECG rate:  78 ?  ECG rate assessment: normal   ?  Rhythm: sinus rhythm   ?  Ectopy: none   ?  Conduction: normal   ?MEDICATIONS ORDERED IN ED: ?Medications - No data to display ?IMPRESSION / MDM / ASSESSMENT AND PLAN / ED COURSE  ?I reviewed the triage vital signs and the nursing notes. ?             ?               ?The patient is on the cardiac monitor to evaluate for evidence of arrhythmia and/or significant  heart rate changes. ?Workup: ECG, CXR, CBC, BMP, Troponin ?Findings: ?ECG: No overt evidence of STEMI. No evidence of Brugada?s sign, delta wave, epsilon wave, significantly prolonged QTc, or malignant arrhythmia ?HS Troponin: Negative x1 ?Other Labs unremarkable for emergent problems.  Mild leukocytosis of 14.6.  Mild AKI at 1.44.  Patient states that she does not drink enough water throughout the day at her job and has known about this kidney injury since last week when she was seen by her primary care physician.  Patient states that this is actually improved since that visit. ?CXR: Without PTX, PNA, or widened mediastinum ?Last Stress Test: 2 weeks ago ?Last Heart Catheterization: Denies ?HEART Score: 2 ? ?Given History, Exam, and Workup I have low suspicion for ACS, Pneumothorax, Pneumonia, Pulmonary Embolus, Tamponade, Aortic Dissection or other emergent problem as a cause for this presentation.  ? ?Reassesment: Prior to discharge patient?s pain was controlled and they were well appearing. ? ?Disposition:  Discharge. Strict return precautions discussed with patient with full understanding. Advised patient to follow up promptly with primary care provider ? ?  ?FINAL CLINICAL IMPRESSION(S) / ED DIAGNOSES  ? ?Final diagnoses:  ?Precordial pain  ? ?Rx / DC Orders  ? ?ED Discharge Orders   ? ? None  ? ?  ? ?Note:  This document was prepared using Dragon voice recognition software and may include unintentional dictation errors. ?  ?Merwyn Katos, MD ?06/26/21 1628 ? ?

## 2021-12-16 ENCOUNTER — Ambulatory Visit
Admission: RE | Admit: 2021-12-16 | Discharge: 2021-12-16 | Disposition: A | Discharge status: Home / Self Care | Payer: BC Managed Care – PPO | Source: Ambulatory Visit | Attending: Family Medicine | Admitting: Family Medicine

## 2021-12-16 ENCOUNTER — Other Ambulatory Visit: Payer: Self-pay | Admitting: Family Medicine

## 2021-12-16 DIAGNOSIS — M7989 Other specified soft tissue disorders: Secondary | ICD-10-CM

## 2023-06-25 IMAGING — CR DG CHEST 2V
2 series · 2 of 2 positions shown · non-contrast
Comparison: 12/24/2014

CLINICAL DATA: Chest pain.  Lightheadedness.

EXAM:
CHEST - 2 VIEW

[chest pa]
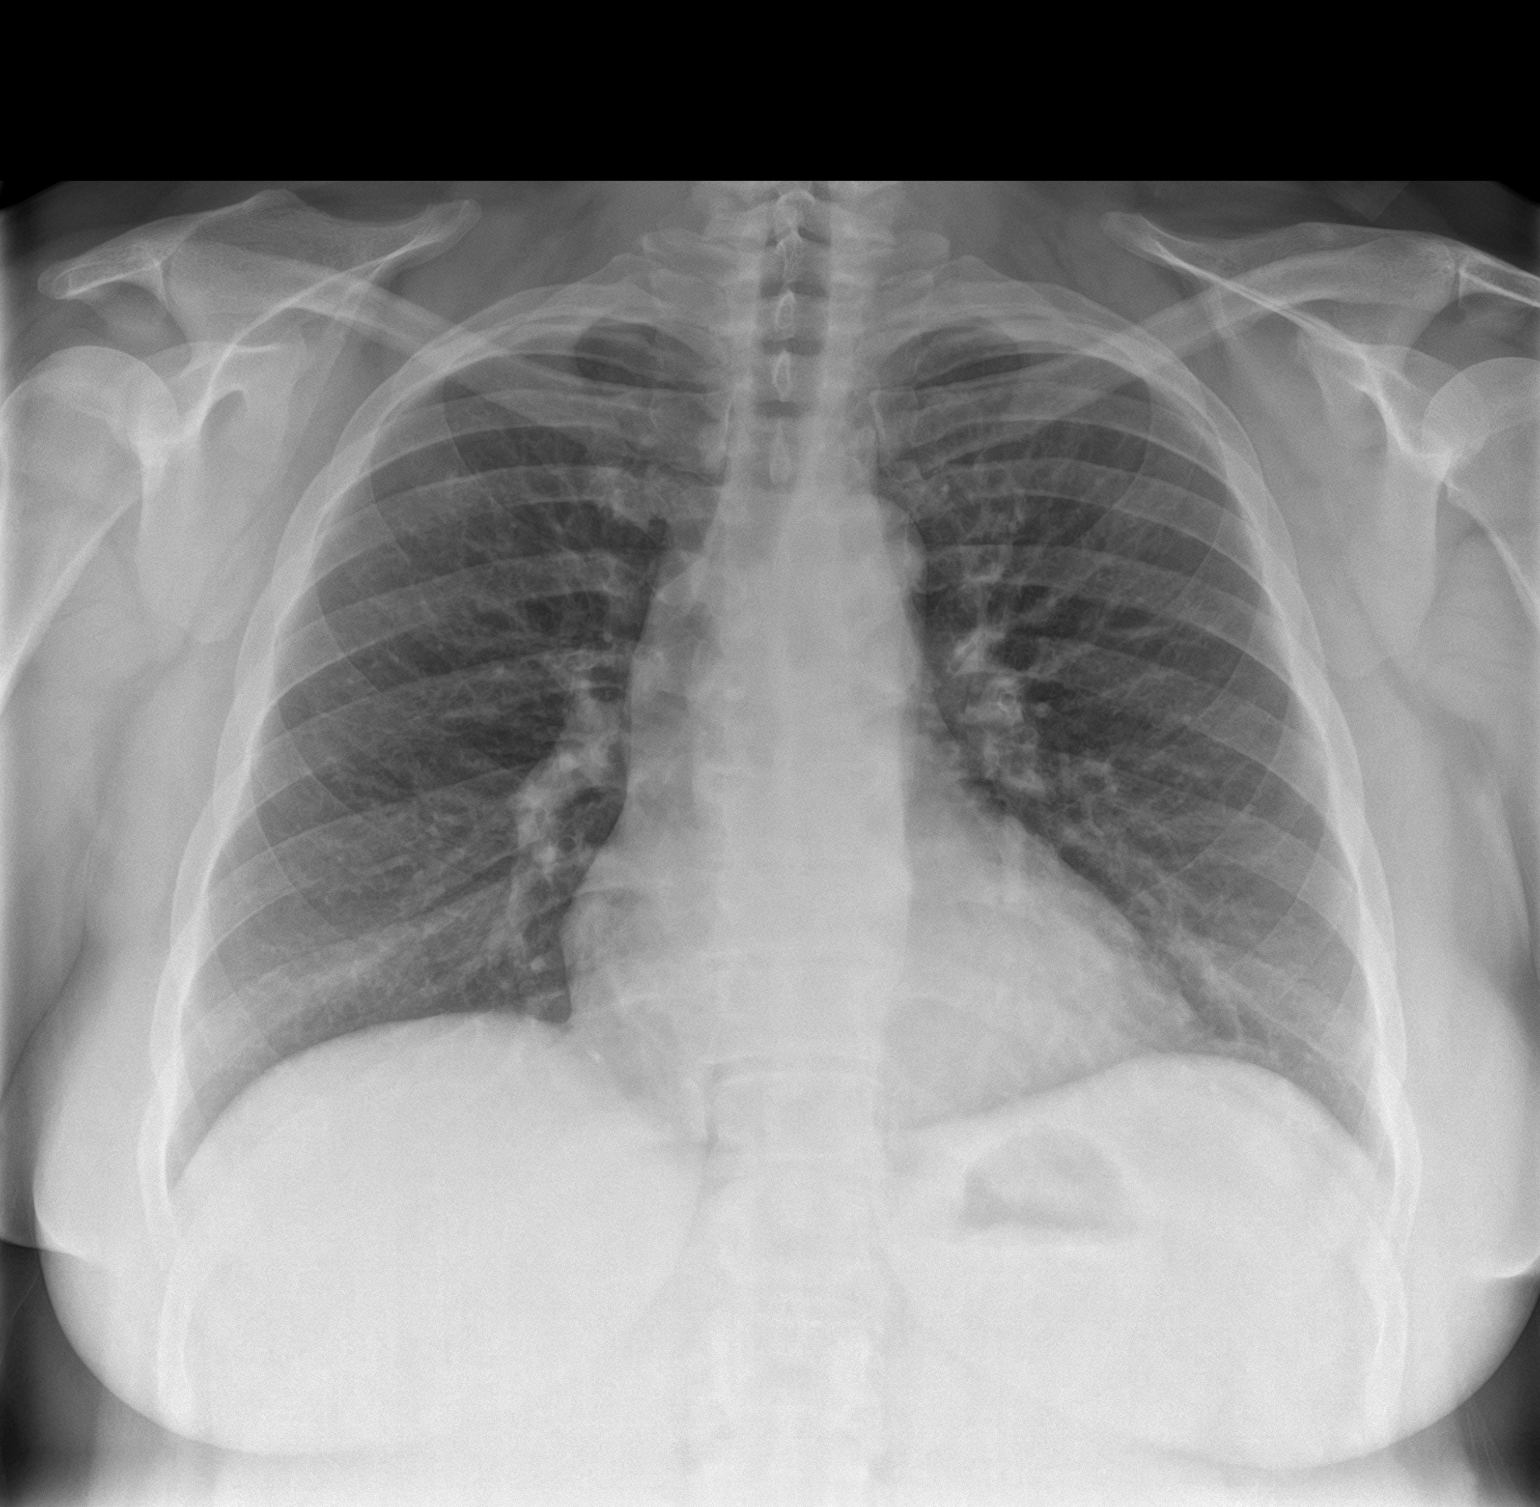

[chest lat]
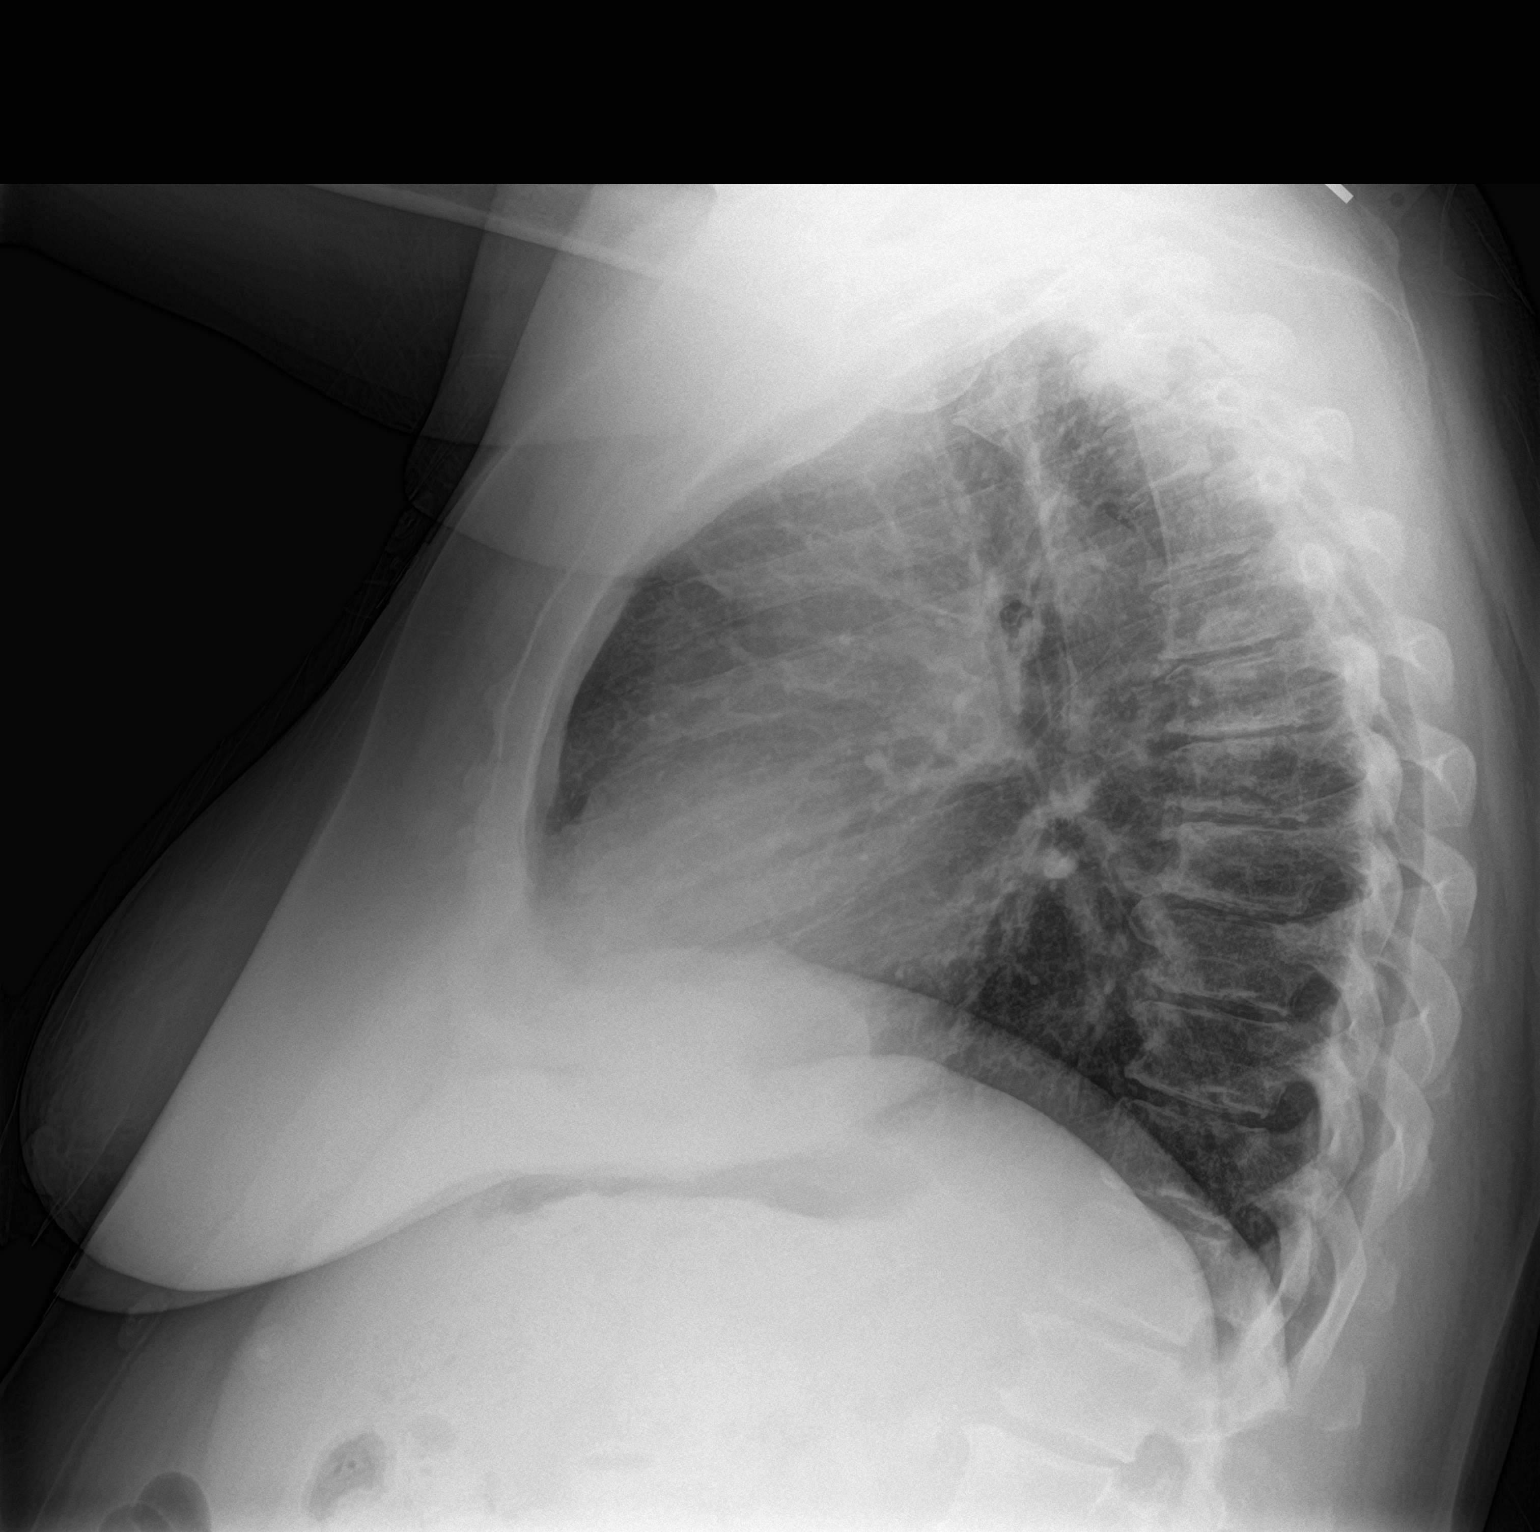

[2 of 2 positions shown; findings below may reference images not displayed]

FINDINGS: The heart size and mediastinal contours are within normal limits.
Both lungs are clear. Thoracic spine degenerative changes again
noted.
IMPRESSION: No active cardiopulmonary disease.
